# Patient Record
Sex: Male | Born: 1985 | Race: White | Hispanic: No | Marital: Single | Smoking: Current every day smoker
Health system: Southern US, Community
[De-identification: ages and names within clinical notes are randomized; demographics above are authoritative.]

---

## 2014-05-24 ENCOUNTER — Encounter (HOSPITAL_COMMUNITY): Payer: Self-pay | Admitting: Emergency Medicine

## 2014-05-24 ENCOUNTER — Emergency Department (HOSPITAL_COMMUNITY)
Admission: EM | Admit: 2014-05-24 | Discharge: 2014-05-25 | Disposition: A | Discharge status: Home / Self Care | Payer: Self-pay | Attending: Emergency Medicine | Admitting: Emergency Medicine

## 2014-05-24 ENCOUNTER — Emergency Department (HOSPITAL_COMMUNITY): Payer: Self-pay

## 2014-05-24 DIAGNOSIS — Z79899 Other long term (current) drug therapy: Secondary | ICD-10-CM | POA: Insufficient documentation

## 2014-05-24 DIAGNOSIS — S90111A Contusion of right great toe without damage to nail, initial encounter: Secondary | ICD-10-CM | POA: Insufficient documentation

## 2014-05-24 DIAGNOSIS — Y9389 Activity, other specified: Secondary | ICD-10-CM | POA: Insufficient documentation

## 2014-05-24 DIAGNOSIS — S99921A Unspecified injury of right foot, initial encounter: Secondary | ICD-10-CM | POA: Insufficient documentation

## 2014-05-24 DIAGNOSIS — Z720 Tobacco use: Secondary | ICD-10-CM | POA: Insufficient documentation

## 2014-05-24 DIAGNOSIS — W208XXA Other cause of strike by thrown, projected or falling object, initial encounter: Secondary | ICD-10-CM | POA: Insufficient documentation

## 2014-05-24 DIAGNOSIS — S92402A Displaced unspecified fracture of left great toe, initial encounter for closed fracture: Secondary | ICD-10-CM | POA: Insufficient documentation

## 2014-05-24 DIAGNOSIS — Y929 Unspecified place or not applicable: Secondary | ICD-10-CM | POA: Insufficient documentation

## 2014-05-24 MED ORDER — TRAMADOL HCL 50 MG PO TABS: 50.0000 mg | ORAL_TABLET | Freq: Four times a day (QID) | ORAL | Status: DC | PRN

## 2014-05-24 MED ORDER — NAPROXEN 500 MG PO TABS
500.0000 mg | ORAL_TABLET | Freq: Two times a day (BID) | ORAL | Status: DC
Start: 2014-05-24 — End: 2022-08-22

## 2014-05-24 NOTE — ED Provider Notes (Signed)
CSN: [id number]     Arrival date & time 05/24/14  2133 History   First MD Initiated Contact with Patient 05/24/14 2159     Chief Complaint  Patient presents with  . Toe Injury     (Consider location/radiation/quality/duration/timing/severity/associated sxs/prior Treatment) HPI Comments: Patient presenting with pain of the great toes bilaterally.  He reports that the pain has been present since a board fell on his toes two days ago.  Pain is constant and is worse with ambulation.  He has not taken anything for the pain prior to arrival.  He reports associated swelling of the left great toe.  He denies numbness or tingling.  Skin is intact.  Denies any pain of the remainder of the foot or other toes.  The history is provided by the patient.    History reviewed. No pertinent past medical history. History reviewed. No pertinent past surgical history. History reviewed. No pertinent family history. History  Substance Use Topics  . Smoking status: Current Every Day Smoker -- 0.50 packs/day    Types: Cigarettes  . Smokeless tobacco: Not on file  . Alcohol Use: No    Review of Systems  All other systems reviewed and are negative.     Allergies  Review of patient's allergies indicates no known allergies.  Home Medications   Prior to Admission medications   Medication Sig Start Date End Date Taking? Authorizing Provider  amphetamine-dextroamphetamine (ADDERALL) 30 MG tablet Take 30 mg by mouth daily.   Yes Historical Provider, MD   BP 124/72  Pulse 111  Temp(Src) 98.5 F (36.9 C) (Oral)  Resp 22  SpO2 96% Physical Exam  Nursing note and vitals reviewed. Constitutional: He appears well-developed and well-nourished.  HENT:  Head: Normocephalic and atraumatic.  Cardiovascular: Normal rate, regular rhythm and normal heart sounds.   Pulses:      Dorsalis pedis pulses are 2+ on the right side, and 2+ on the left side.  Pulmonary/Chest: Effort normal and breath sounds normal.   Musculoskeletal:  Pain with ROM of both great toes Mild swelling of the left great toe No swelling of the right great toe Small amount of mild bruising at the base of the right great toe nail No tenderness to palpation of the foot aside from the great toes.   Patient able to wiggle all toes.    Neurological: He is alert.  Distal sensation of all toes intact  Skin: Skin is warm and dry.  Psychiatric: He has a normal mood and affect.    ED Course  Procedures (including critical care time) Labs Review Labs Reviewed - No data to display  Imaging Review Dg Toe Great Left  05/24/2014   CLINICAL DATA:  Metal bar hit left great toe, pain  EXAM: LEFT GREAT TOE  COMPARISON:  None.  FINDINGS: Nondisplaced fracture involving the lateral base of the 1st distal phalanx.  The joint spaces are preserved.  The visualized soft tissues are unremarkable.  IMPRESSION: Nondisplaced fracture involving the lateral base of the 1st distal phalanx.   Electronically Signed   By: Charline BillsSriyesh  Krishnan M.D.   On: 05/24/2014 23:14   Dg Toe Great Right  05/24/2014   CLINICAL DATA:  Metal bar fell on right great toe, pain  EXAM: RIGHT GREAT TOE  COMPARISON:  None.  FINDINGS: No fracture or dislocation is seen.  The joint spaces are preserved.  The visualized soft tissues are unremarkable.  IMPRESSION: No fracture or dislocation is seen.   Electronically Signed  By: Charline Bills M.D.   On: 05/24/2014 23:14     EKG Interpretation None      MDM   Final diagnoses:  None   Patient presenting with bilateral great toe pain that has been present since injuring it two days ago.  Xray showing nondisplaced fracture of the 1st distal phalanx.  Right great toe xray negative.  Patient neurovascularly intact.  Skin intact.  Patient given post op shoe and crutches.  Stable for discharge.    Santiago Glad, PA-C 05/25/14 0006

## 2014-05-24 NOTE — Progress Notes (Signed)
Orthopedic Tech Progress Note Patient Details:  Todd GravesBrandon Thompson [birth year] [id number]  Ortho Devices Type of Ortho Device: Postop shoe/boot;Crutches Ortho Device/Splint Interventions: Application   [name], Goble Fudala M 05/24/2014, 11:43 PM

## 2014-05-24 NOTE — ED Notes (Signed)
Patient arrives with complaint of bilateral great toe injuries. 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 This happened on Tuesday. Patient states that the pain has been consistent and unrelieved at home. Right toe appears bruised, left toe appears normal but is tender to palpation. Patient ambulatory. Denies other injury.

## 2014-05-24 NOTE — ED Notes (Signed)
Ortho at [facility]

## 2014-05-28 NOTE — ED Provider Notes (Signed)
Medical screening examination/treatment/procedure(s) were performed by non-physician practitioner and as supervising physician I was immediately available for consultation/collaboration.   EKG Interpretation None        Kaulin Chaves J. Marijke Guadiana, MD 05/28/14 0700 

## 2015-03-22 ENCOUNTER — Emergency Department (HOSPITAL_COMMUNITY)
Admission: EM | Admit: 2015-03-22 | Discharge: 2015-03-22 | Disposition: A | Discharge status: Home / Self Care | Payer: Self-pay | Attending: Emergency Medicine | Admitting: Emergency Medicine

## 2015-03-22 ENCOUNTER — Encounter (HOSPITAL_COMMUNITY): Payer: Self-pay

## 2015-03-22 DIAGNOSIS — Z720 Tobacco use: Secondary | ICD-10-CM | POA: Insufficient documentation

## 2015-03-22 DIAGNOSIS — L259 Unspecified contact dermatitis, unspecified cause: Secondary | ICD-10-CM | POA: Insufficient documentation

## 2015-03-22 DIAGNOSIS — Z791 Long term (current) use of non-steroidal anti-inflammatories (NSAID): Secondary | ICD-10-CM | POA: Insufficient documentation

## 2015-03-22 DIAGNOSIS — Z79899 Other long term (current) drug therapy: Secondary | ICD-10-CM | POA: Insufficient documentation

## 2015-03-22 MED ORDER — CEPHALEXIN 500 MG PO CAPS: 500.0000 mg | ORAL_CAPSULE | Freq: Four times a day (QID) | ORAL | Status: DC

## 2015-03-22 MED ORDER — CEPHALEXIN 250 MG PO CAPS
500.0000 mg | ORAL_CAPSULE | Freq: Once | ORAL | Status: AC
  Administered 2015-03-22: 500 mg via ORAL
  Filled 2015-03-22: qty 2

## 2015-03-22 MED ORDER — PREDNISONE 20 MG PO TABS
60.0000 mg | ORAL_TABLET | Freq: Once | ORAL | Status: AC
  Administered 2015-03-22: 60 mg via ORAL
  Filled 2015-03-22: qty 3

## 2015-03-22 MED ORDER — HYDROXYZINE HCL 25 MG PO TABS
25.0000 mg | ORAL_TABLET | Freq: Once | ORAL | Status: AC
  Administered 2015-03-22: 25 mg via ORAL
  Filled 2015-03-22: qty 1

## 2015-03-22 MED ORDER — PREDNISONE 20 MG PO TABS: 60.0000 mg | ORAL_TABLET | Freq: Every day | ORAL | Status: DC

## 2015-03-22 MED ORDER — HYDROXYZINE HCL 25 MG PO TABS: 25.0000 mg | ORAL_TABLET | Freq: Four times a day (QID) | ORAL | Status: DC

## 2015-03-22 NOTE — ED Notes (Signed)
Patient is alert and orientedx4.  Patient was explained discharge instructions and they understood them with no questions.   

## 2015-03-22 NOTE — Discharge Instructions (Signed)

## 2015-03-22 NOTE — ED Provider Notes (Signed)
CSN: [id number]     Arrival date & time 03/22/15  4098 History   First MD Initiated Contact with Patient 03/22/15 [phone]     Chief Complaint  Patient presents with  . Rash  . Poison Ivy     (Consider location/radiation/quality/duration/timing/severity/associated sxs/prior Treatment) Patient is a 29 y.o. male presenting with rash and poison ivy. The history is provided by the patient. No language interpreter was used.  Rash Location:  Full body Quality: blistering, itchiness, painful and redness   Associated symptoms: no fever   Associated symptoms comment:  Patient with a history recent exposure to poison oak presents with widespread rash for the past week. It started on UE's bilaterally and continued to spread to face, lower extremities and torso. No fever. He states some of the lesions are now extremely red and he was concerned about infection.  Poison Ivy Associated symptoms include a rash. Pertinent negatives include no fever.    History reviewed. No pertinent past medical history. History reviewed. No pertinent past surgical history. History reviewed. No pertinent family history. History  Substance Use Topics  . Smoking status: Current Every Day Smoker -- 0.50 packs/day    Types: Cigarettes  . Smokeless tobacco: Not on file  . Alcohol Use: No    Review of Systems  Constitutional: Negative for fever.  Skin: Positive for rash.      Allergies  Review of patient's allergies indicates no known allergies.  Home Medications   Prior to Admission medications   Medication Sig Start Date End Date Taking? Authorizing Provider  amphetamine-dextroamphetamine (ADDERALL) 30 MG tablet Take 30 mg by mouth daily.    Historical Provider, MD  naproxen (NAPROSYN) 500 MG tablet Take 1 tablet (500 mg total) by mouth 2 (two) times daily. 05/24/14   Heather Laisure, PA-C  traMADol (ULTRAM) 50 MG tablet Take 1 tablet (50 mg total) by mouth every 6 (six) hours as needed. 05/24/14   Heather  Laisure, PA-C   BP 109/57 mmHg  Pulse 88  Temp(Src) 97.7 F (36.5 C) (Oral)  Resp 20  Ht  (1.702 m)  Wt 175 lb (79.379 kg)  BMI 27.40 kg/m2  SpO2 100% Physical Exam  Constitutional: He is oriented to person, place, and time. He appears well-developed and well-nourished.  Neck: Normal range of motion.  Pulmonary/Chest: Effort normal.  Musculoskeletal: Normal range of motion.  Neurological: He is alert and oriented to person, place, and time.  Skin: Skin is warm and dry.  Rash consisting of round, erythematous ulcerations in various stages of healing in a generalized distribution.   Psychiatric: He has a normal mood and affect.    ED Course  Procedures (including critical care time) Labs Review Labs Reviewed - No data to display  Imaging Review No results found.   EKG Interpretation None      MDM   Final diagnoses:  None    1. Contact dermatitis  Someo f the scabbed lesions appear significantly erythematous and tender. Will cover with Keflex, start prednisone and provide Atarax as benadryl has not provided itch relief.     Elpidio Anis, PA-C 03/22/15 4782  Dione Booze, MD 03/22/15 [phone]

## 2015-03-22 NOTE — ED Notes (Signed)
Here for rash to arms and face after being oustide Friday and sweating and sts it has spread to more of body and possibly infected.

## 2015-11-23 IMAGING — CR DG TOE GREAT 2+V*R*
3 series · 3 of 3 positions shown · non-contrast
Comparison: None.

CLINICAL DATA: Metal bar fell on right great toe, pain

EXAM:
RIGHT GREAT TOE

[t toes ap right]
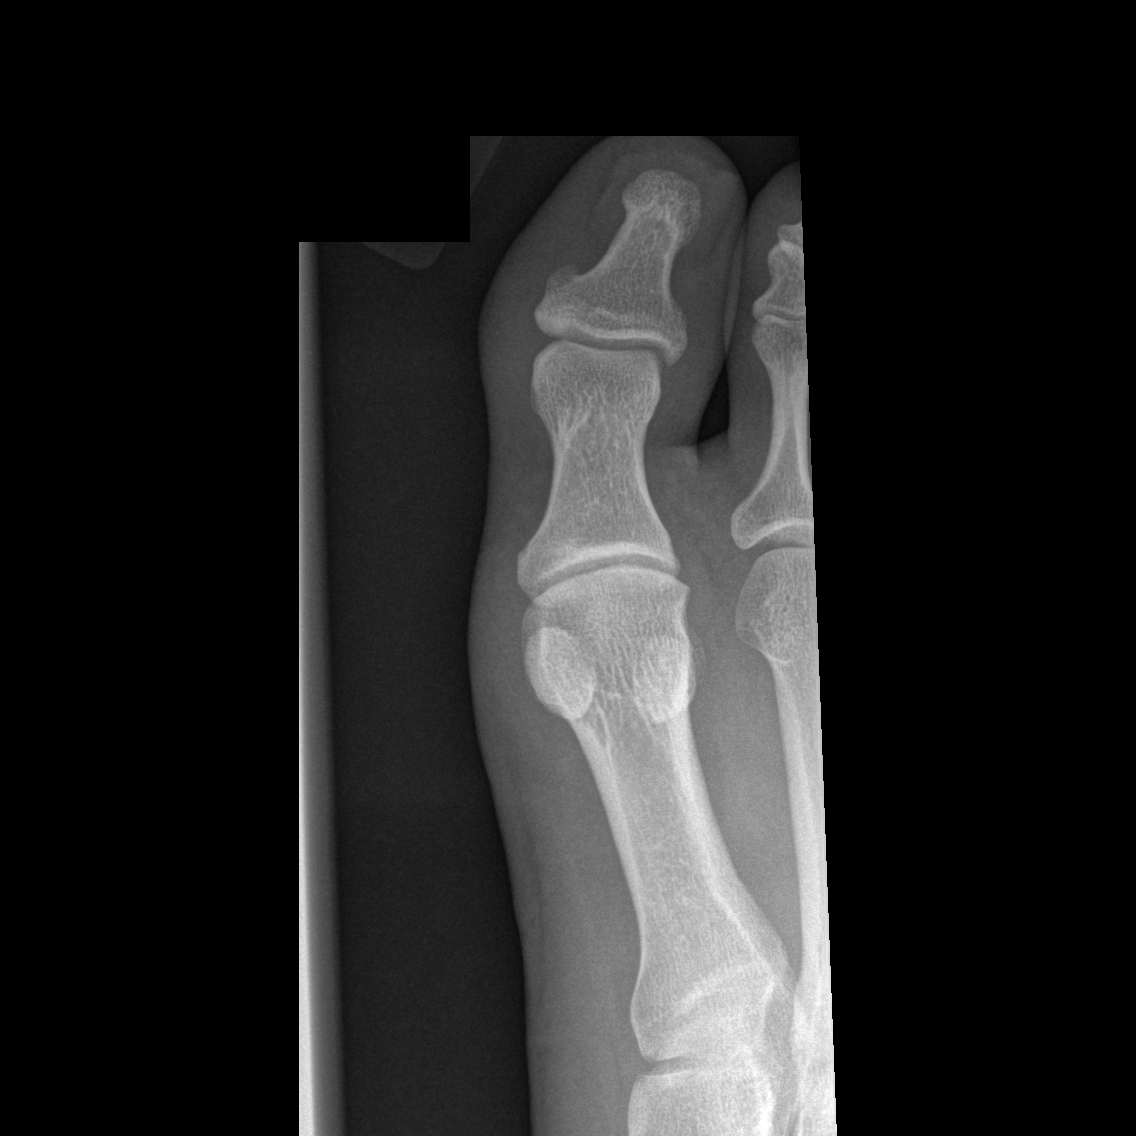

[t toes oblique right]
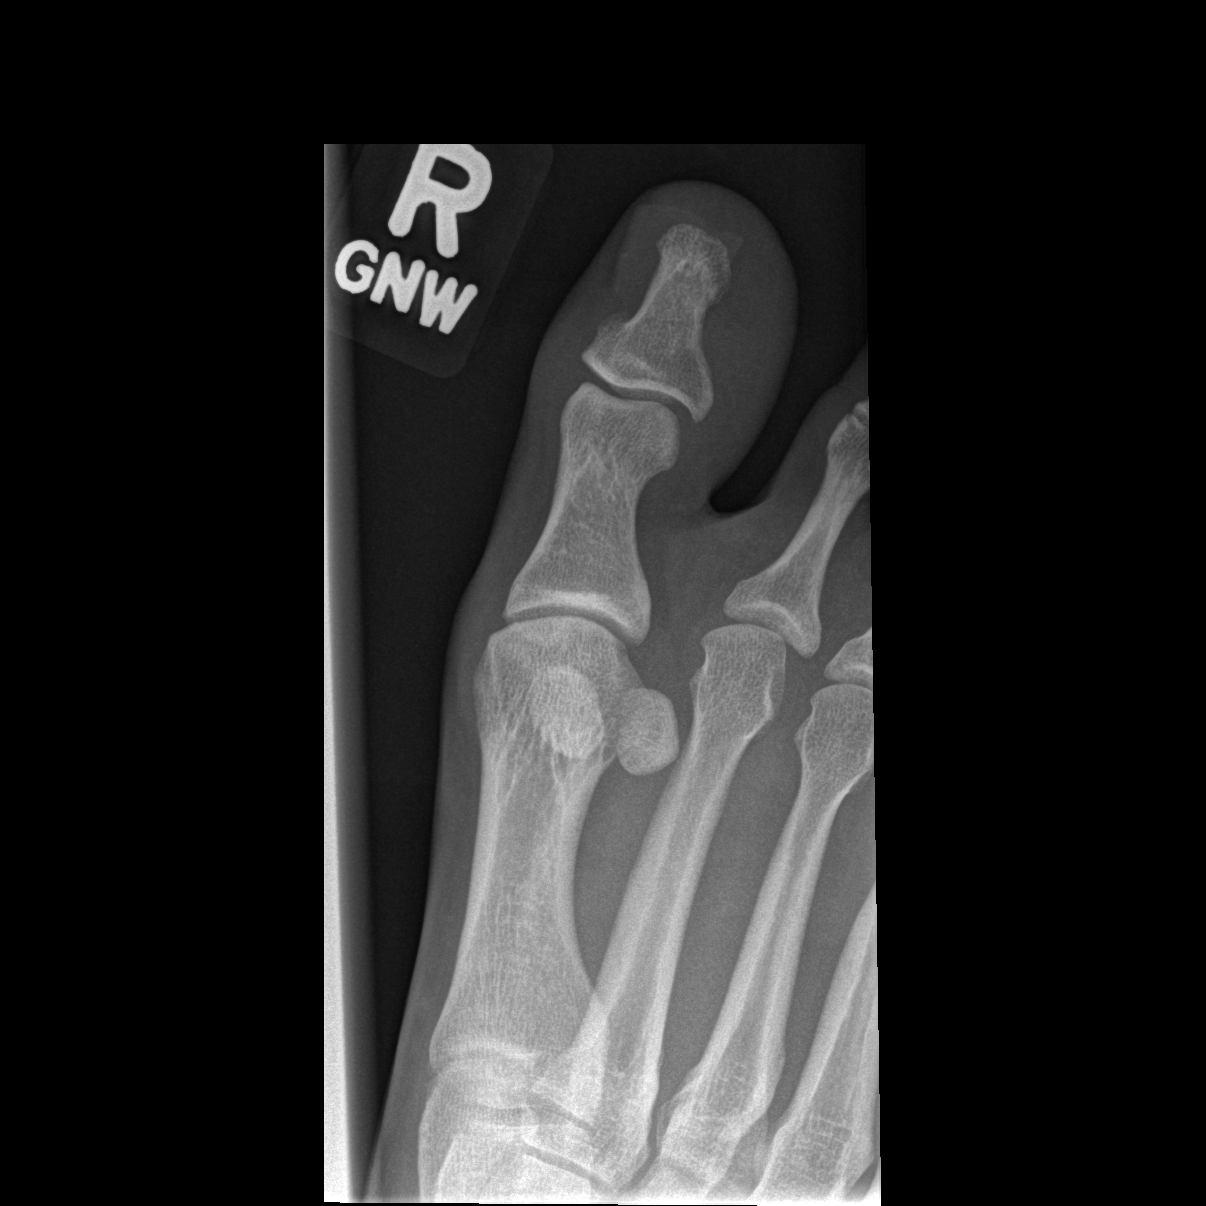

[t toes lateral right]
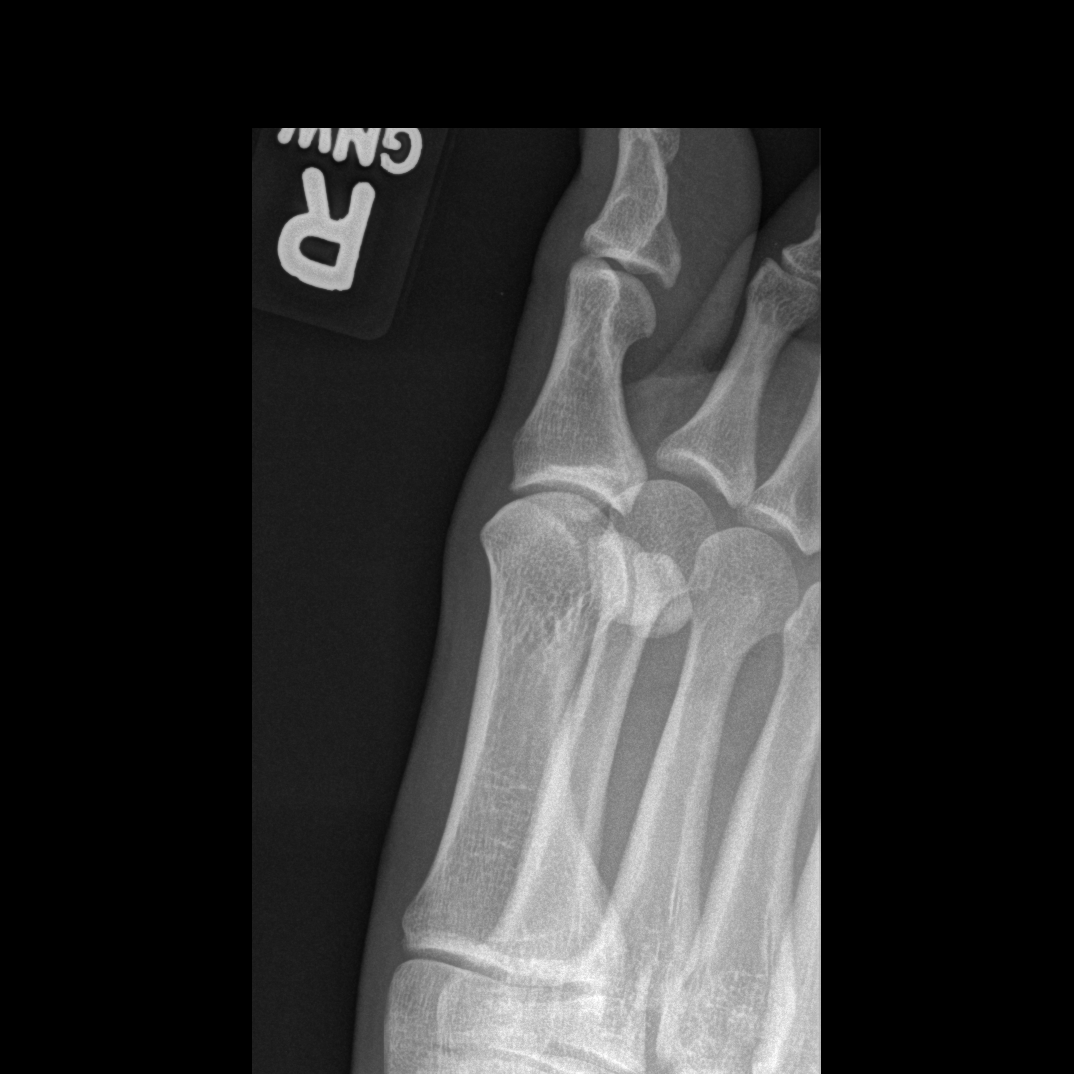

[3 of 3 positions shown; findings below may reference images not displayed]

FINDINGS: No fracture or dislocation is seen.

The joint spaces are preserved.

The visualized soft tissues are unremarkable.
IMPRESSION: No fracture or dislocation is seen.

## 2016-12-12 ENCOUNTER — Encounter (HOSPITAL_COMMUNITY): Payer: Self-pay | Admitting: Nurse Practitioner

## 2016-12-12 ENCOUNTER — Emergency Department (HOSPITAL_COMMUNITY)
Admission: EM | Admit: 2016-12-12 | Discharge: 2016-12-12 | Disposition: A | Discharge status: Home / Self Care | Payer: Self-pay | Attending: Emergency Medicine | Admitting: Emergency Medicine

## 2016-12-12 DIAGNOSIS — F17210 Nicotine dependence, cigarettes, uncomplicated: Secondary | ICD-10-CM | POA: Insufficient documentation

## 2016-12-12 DIAGNOSIS — K029 Dental caries, unspecified: Secondary | ICD-10-CM | POA: Insufficient documentation

## 2016-12-12 DIAGNOSIS — Z79899 Other long term (current) drug therapy: Secondary | ICD-10-CM | POA: Insufficient documentation

## 2016-12-12 DIAGNOSIS — K0889 Other specified disorders of teeth and supporting structures: Secondary | ICD-10-CM

## 2016-12-12 MED ORDER — CLINDAMYCIN HCL 300 MG PO CAPS: 300.0000 mg | ORAL_CAPSULE | Freq: Three times a day (TID) | ORAL | 0 refills | Status: AC

## 2016-12-12 MED ORDER — OXYCODONE-ACETAMINOPHEN 5-325 MG PO TABS
1.0000 | ORAL_TABLET | Freq: Once | ORAL | Status: AC
  Administered 2016-12-12: 1 via ORAL
  Filled 2016-12-12: qty 1

## 2016-12-12 MED ORDER — TRAMADOL HCL 50 MG PO TABS: 50.0000 mg | ORAL_TABLET | Freq: Four times a day (QID) | ORAL | 0 refills | Status: AC | PRN

## 2016-12-12 MED ORDER — IBUPROFEN 600 MG PO TABS: 600.0000 mg | ORAL_TABLET | Freq: Four times a day (QID) | ORAL | 0 refills | Status: DC | PRN

## 2016-12-12 NOTE — ED Provider Notes (Signed)
[facility]-EMERGENCY DEPT Provider Note   CSN: [id number] Arrival date & time: 12/12/16  1402   By signing my name below, I, Teofilo Pod, attest that this documentation has been prepared under the direction and in the presence of Audry Pili, PA-C. Electronically Signed: Teofilo Pod, ED Scribe. 12/12/2016. 4:13 PM.   History   Chief Complaint Chief Complaint  Patient presents with  . Dental Pain    The history is provided by the patient. No language interpreter was used.  HPI Comments:  Todd Thompson is a 31 y.o. male who presents to the Emergency Department complaining of constant left sided upper dental pain x 2 days. Pt complains of associated gum swelling. Pt does not have a dentist at this time. Denies any known allergies to medications. He has taken tylenol with no relief. Denies any trouble swallowing.   History reviewed. No pertinent past medical history.  There are no active problems to display for this patient.   History reviewed. No pertinent surgical history.     Home Medications    Prior to Admission medications   Medication Sig Start Date End Date Taking? Authorizing Provider  amphetamine-dextroamphetamine (ADDERALL) 30 MG tablet Take 30 mg by mouth daily.    Historical Provider, MD  cephALEXin (KEFLEX) 500 MG capsule Take 1 capsule (500 mg total) by mouth 4 (four) times daily. 03/22/15   Elpidio Anis, PA-C  hydrOXYzine (ATARAX/VISTARIL) 25 MG tablet Take 1 tablet (25 mg total) by mouth every 6 (six) hours. 03/22/15   Elpidio Anis, PA-C  naproxen (NAPROSYN) 500 MG tablet Take 1 tablet (500 mg total) by mouth 2 (two) times daily. 05/24/14   Heather Laisure, PA-C  predniSONE (DELTASONE) 20 MG tablet Take 3 tablets (60 mg total) by mouth daily. 03/22/15   Elpidio Anis, PA-C  traMADol (ULTRAM) 50 MG tablet Take 1 tablet (50 mg total) by mouth every 6 (six) hours as needed. 05/24/14   Santiago Glad, PA-C    Family History History reviewed. No pertinent  family history.  Social History Social History  Substance Use Topics  . Smoking status: Current Every Day Smoker    Packs/day: 0.50    Types: Cigarettes  . Smokeless tobacco: Never Used  . Alcohol use No     Allergies   Patient has no known allergies.   Review of Systems Review of Systems  Constitutional: Negative for fever.  HENT: Positive for dental problem and facial swelling. Negative for trouble swallowing.    Physical Exam Updated Vital Signs BP 134/82   Pulse (!) 113   Temp 98.4 F (36.9 C) (Oral)   Resp 16   SpO2 97%   Physical Exam  Constitutional: He is oriented to person, place, and time. Vital signs are normal. He appears well-developed and well-nourished.  HENT:  Head: Normocephalic and atraumatic.  Right Ear: Hearing normal.  Left Ear: Hearing normal.  Poor dentition noted on frontal incisors. No obvious swelling or purulence. No trismus, no uvula deviation, phonating well.   Eyes: Conjunctivae and EOM are normal. Pupils are equal, round, and reactive to light.  Cardiovascular: Normal rate and regular rhythm.   Pulmonary/Chest: Effort normal.  Neurological: He is alert and oriented to person, place, and time.  Skin: Skin is warm and dry.  Psychiatric: He has a normal mood and affect. His speech is normal and behavior is normal. Thought content normal.  Nursing note and vitals reviewed.  ED Treatments / Results  DIAGNOSTIC STUDIES:  Oxygen Saturation is 97% on  RA, normal by my interpretation.    COORDINATION OF CARE:  4:10 PM Will refer to a dentist. Discussed treatment plan with pt at bedside and pt agreed to plan.   Labs (all labs ordered are listed, but only abnormal results are displayed) Labs Reviewed - No data to display  EKG  EKG Interpretation None       Radiology No results found.  Procedures Procedures (including critical care time)  Medications Ordered in ED Medications - No data to display   Initial Impression /  Assessment and Plan / ED Course  I have reviewed the triage vital signs and the nursing notes.  Pertinent labs & imaging results that were available during my care of the patient were reviewed by me and considered in my medical decision making (see chart for details).  Final Clinical Impressions(s) / ED Diagnoses     {I have reviewed the relevant previous healthcare records.  {I obtained HPI from historian.   ED Course:  Assessment: Dental pain associated with dental cary but no signs or symptoms of dental abscess with patient afebrile, non toxic appearing and swallowing secretions well. Exam unconcerning for Ludwig's angina or other deep tissue infection in neck. No facial swelling. Noted gum findings. Will Rx ABX. Will treat with pain medication.  I gave patient referral to dentist and stressed the importance of dental follow up for ultimate management of dental pain. Patient voices understanding and is agreeable to plan.  Disposition/Plan:  DC Home Additional Verbal discharge instructions given and discussed with patient.  Pt Instructed to f/u with Dentist in the next week for evaluation and treatment of symptoms. Return precautions given Pt acknowledges and agrees with plan  Supervising Physician Melene Plan, DO  Final diagnoses:  Pain, dental  Dental caries    New Prescriptions New Prescriptions   No medications on file   I personally performed the services described in this documentation, which was scribed in my presence. The recorded information has been reviewed and is accurate.    Audry Pili, PA-C 12/12/16 1628    Melene Plan, DO 12/13/16 [phone]

## 2016-12-12 NOTE — ED Triage Notes (Signed)
Pt presents with c/o dental pain. The pain began 2 days go. He c/o gum swelling. He has tried tylenol with no relief

## 2016-12-12 NOTE — Discharge Instructions (Signed)
Please read and follow all provided instructions.  Your diagnoses today include:  1. Pain, dental   2. Dental caries     Tests performed today include: Vital signs. See below for your results today.   Medications prescribed:  Take as prescribed   Home care instructions:  Follow any educational materials contained in this packet.  Follow-up instructions: Please follow-up with a Dentist for further evaluation of symptoms and treatment   Return instructions:  Please return to the Emergency Department if you do not get better, if you get worse, or new symptoms OR  - Fever (temperature greater than 101.92F)  - Bleeding that does not stop with holding pressure to the area    -Severe pain (please note that you may be more sore the day after your accident)  - Chest Pain  - Difficulty breathing  - Severe nausea or vomiting  - Inability to tolerate food and liquids  - Passing out  - Skin becoming red around your wounds  - Change in mental status (confusion or lethargy)  - New numbness or weakness    Please return if you have any other emergent concerns.  Additional Information:  Your vital signs today were: BP 134/82    Pulse (!) 113    Temp 98.4 F (36.9 C) (Oral)    Resp 16    SpO2 97%  If your blood pressure (BP) was elevated above 135/85 this visit, please have this repeated by your doctor within one month. ---------------

## 2017-05-16 DIAGNOSIS — F419 Anxiety disorder, unspecified: Secondary | ICD-10-CM

## 2017-05-16 DIAGNOSIS — E669 Obesity, unspecified: Secondary | ICD-10-CM

## 2017-05-16 DIAGNOSIS — Z789 Other specified health status: Secondary | ICD-10-CM

## 2017-05-16 DIAGNOSIS — R480 Dyslexia and alexia: Secondary | ICD-10-CM

## 2017-05-16 DIAGNOSIS — F909 Attention-deficit hyperactivity disorder, unspecified type: Secondary | ICD-10-CM

## 2017-05-16 DIAGNOSIS — F319 Bipolar disorder, unspecified: Secondary | ICD-10-CM

## 2017-05-16 DIAGNOSIS — L03113 Cellulitis of right upper limb: Secondary | ICD-10-CM

## 2017-05-16 DIAGNOSIS — Z720 Tobacco use: Secondary | ICD-10-CM

## 2018-02-23 DIAGNOSIS — N179 Acute kidney failure, unspecified: Secondary | ICD-10-CM

## 2018-02-23 DIAGNOSIS — F320 Major depressive disorder, single episode, mild: Secondary | ICD-10-CM

## 2018-02-23 DIAGNOSIS — E860 Dehydration: Secondary | ICD-10-CM

## 2018-02-23 DIAGNOSIS — F1910 Other psychoactive substance abuse, uncomplicated: Secondary | ICD-10-CM

## 2018-02-25 DIAGNOSIS — I517 Cardiomegaly: Secondary | ICD-10-CM

## 2018-02-25 DIAGNOSIS — N189 Chronic kidney disease, unspecified: Secondary | ICD-10-CM

## 2019-03-15 ENCOUNTER — Emergency Department (HOSPITAL_COMMUNITY)
Admission: EM | Admit: 2019-03-15 | Discharge: 2019-03-15 | Disposition: A | Discharge status: Home / Self Care | Payer: Self-pay | Attending: Emergency Medicine | Admitting: Emergency Medicine

## 2019-03-15 ENCOUNTER — Other Ambulatory Visit: Payer: Self-pay

## 2019-03-15 DIAGNOSIS — F17210 Nicotine dependence, cigarettes, uncomplicated: Secondary | ICD-10-CM | POA: Insufficient documentation

## 2019-03-15 DIAGNOSIS — B353 Tinea pedis: Secondary | ICD-10-CM | POA: Insufficient documentation

## 2019-03-15 DIAGNOSIS — S76011A Strain of muscle, fascia and tendon of right hip, initial encounter: Secondary | ICD-10-CM | POA: Insufficient documentation

## 2019-03-15 DIAGNOSIS — Y939 Activity, unspecified: Secondary | ICD-10-CM | POA: Insufficient documentation

## 2019-03-15 DIAGNOSIS — Y929 Unspecified place or not applicable: Secondary | ICD-10-CM | POA: Insufficient documentation

## 2019-03-15 DIAGNOSIS — Y999 Unspecified external cause status: Secondary | ICD-10-CM | POA: Insufficient documentation

## 2019-03-15 DIAGNOSIS — Z79899 Other long term (current) drug therapy: Secondary | ICD-10-CM | POA: Insufficient documentation

## 2019-03-15 DIAGNOSIS — X500XXA Overexertion from strenuous movement or load, initial encounter: Secondary | ICD-10-CM | POA: Insufficient documentation

## 2019-03-15 MED ORDER — CLOTRIMAZOLE 1 % EX CREA: TOPICAL_CREAM | CUTANEOUS | 0 refills | Status: AC

## 2019-03-15 NOTE — Discharge Instructions (Signed)
Please read attached information. If you experience any new or worsening signs or symptoms please return to the emergency room for evaluation. Please follow-up with your primary care provider or specialist as discussed.  °

## 2019-03-15 NOTE — ED Provider Notes (Signed)
[facility] Provider Note   CSN: [id number] Arrival date & time: 03/15/19  1554    History   Chief Complaint Chief Complaint  Patient presents with  . Groin Pain  . Tinea Pedis    HPI Todd Thompson is a 33 y.o. male.     HPI   33 year old male presents today with several complaints.  Patient notes that he jumped into his mother's pickup truck and felt a pulling sensation in his right groin.  He notes the pain has persisted he.  He denies any swelling or edema, denies any testicular pain or masses, no urinary symptoms or abdominal pain.  Patient also notes a rash to his bilateral feet that is itchy.   No past medical history on file.  There are no active problems to display for this patient.   No past surgical history on file.      Home Medications    Prior to Admission medications   Medication Sig Start Date End Date Taking? Authorizing Provider  amphetamine-dextroamphetamine (ADDERALL) 30 MG tablet Take 30 mg by mouth daily.    [provider]  cephALEXin (KEFLEX) 500 MG capsule Take 1 capsule (500 mg total) by mouth 4 (four) times daily. 03/22/15   Elpidio AnisUpstill, Shari, PA-C  clotrimazole (LOTRIMIN) 1 % cream Apply to affected area 2 times daily 03/15/19   Ritchard Paragas, Tinnie GensJeffrey, PA-C  hydrOXYzine (ATARAX/VISTARIL) 25 MG tablet Take 1 tablet (25 mg total) by mouth every 6 (six) hours. 03/22/15   Elpidio AnisUpstill, Shari, PA-C  ibuprofen (ADVIL,MOTRIN) 600 MG tablet Take 1 tablet (600 mg total) by mouth every 6 (six) hours as needed. 12/12/16   Audry PiliMohr, Tyler, PA-C  naproxen (NAPROSYN) 500 MG tablet Take 1 tablet (500 mg total) by mouth 2 (two) times daily. 05/24/14   Santiago GladLaisure, Heather, PA-C  predniSONE (DELTASONE) 20 MG tablet Take 3 tablets (60 mg total) by mouth daily. 03/22/15   Elpidio AnisUpstill, Shari, PA-C  traMADol (ULTRAM) 50 MG tablet Take 1 tablet (50 mg total) by mouth every 6 (six) hours as needed. 12/12/16   Audry PiliMohr, Tyler, PA-C    Family History  No family history on file.  Social History Social History   Tobacco Use  . Smoking status: Current Every Day Smoker    Packs/day: 0.50    Types: Cigarettes  . Smokeless tobacco: Never Used  Substance Use Topics  . Alcohol use: No  . Drug use: No     Allergies   Patient has no known allergies.   Review of Systems Review of Systems  All other systems reviewed and are negative.    Physical Exam Updated Vital Signs BP 123/83 (BP Location: Right Arm)   Pulse 100   Temp 99.3 F (37.4 C) (Oral)   Resp 16   SpO2 100%   Physical Exam Vitals signs and nursing note reviewed.  Constitutional:      Appearance: He is well-developed.  HENT:     Head: Normocephalic and atraumatic.  Eyes:     General: No scleral icterus.       Right eye: No discharge.        Left eye: No discharge.     Conjunctiva/sclera: Conjunctivae normal.     Pupils: Pupils are equal, round, and reactive to light.  Neck:     Musculoskeletal: Normal range of motion.     Vascular: No JVD.     Trachea: No tracheal deviation.  Pulmonary:     Effort: Pulmonary effort is normal.  Breath sounds: No stridor.  Abdominal:     Comments: Abdomen soft nontender no masses rebound or guarding no hernias  Genitourinary:    Comments: Bilateral descended testicles, no hernias noted Musculoskeletal:     Comments: Tenderness palpation the right ASIS in the anterior groin, pain worse with active flexion at the right hip minimal pain with passive flexion no swelling or redness  Neurological:     Mental Status: He is alert and oriented to person, place, and time.     Coordination: Coordination normal.  Psychiatric:        Behavior: Behavior normal.        Thought Content: Thought content normal.        Judgment: Judgment normal.      ED Treatments / Results  Labs (all labs ordered are listed, but only abnormal results are displayed) Labs Reviewed - No data to display  EKG None  Radiology No results  found.  Procedures Procedures (including critical care time)  Medications Ordered in ED Medications - No data to display   Initial Impression / Assessment and Plan / ED Course  I have reviewed the triage vital signs and the nursing notes.  Pertinent labs & imaging results that were available during my care of the patient were reviewed by me and considered in my medical decision making (see chart for details).        Patient here with likely hip flexor strain.  No signs of hernia.  Patient also has tinea pedis and will be treated with antifungal.  Strict return precautions given, symptomatic care instructions given.  He verbalized understanding and agreement to today's plan had no further questions or concerns.  Final Clinical Impressions(s) / ED Diagnoses   Final diagnoses:  Strain of flexor muscle of right hip, initial encounter  Tinea pedis of both feet    ED Discharge Orders         Ordered    clotrimazole (LOTRIMIN) 1 % cream     03/15/19 1751           Okey Regal, PA-C 03/16/19 1242    Blanchie Dessert, MD 03/18/19 [phone]

## 2019-03-15 NOTE — ED Triage Notes (Signed)
Onset in the past 2 days pt was getting in moms truck and jumped up, grabbed hold of bar and felt something pull.  Also has athletes foot in right foot, skin is cracked in between toes and hurts to put pressure on right foot.  Also, c/o back pain.

## 2020-09-05 ENCOUNTER — Ambulatory Visit (HOSPITAL_COMMUNITY)
Admission: EM | Admit: 2020-09-05 | Discharge: 2020-09-05 | Discharge status: Against Medical Advice | Payer: No Payment, Other

## 2020-09-05 ENCOUNTER — Other Ambulatory Visit: Payer: Self-pay

## 2020-09-05 NOTE — ED Notes (Signed)
Left after triage without being seen by provider. Stated he had somewhere he had to be

## 2020-09-05 NOTE — ED Triage Notes (Signed)
Patient presents to [facility] as a walk-in, denies , SI, HI and AVH. Patient states "[facility] sent me here to get my medications, they said you have taken over what they were doings. [facility] does not take my insurance anymore."

## 2020-11-11 ENCOUNTER — Ambulatory Visit (HOSPITAL_COMMUNITY)
Admission: EM | Admit: 2020-11-11 | Discharge: 2020-11-11 | Disposition: A | Discharge status: Home / Self Care | Payer: No Payment, Other | Attending: Licensed Clinical Social Worker | Admitting: Licensed Clinical Social Worker

## 2020-11-11 ENCOUNTER — Other Ambulatory Visit: Payer: Self-pay

## 2020-11-11 DIAGNOSIS — Z1330 Encounter for screening examination for mental health and behavioral disorders, unspecified: Secondary | ICD-10-CM | POA: Insufficient documentation

## 2020-11-11 DIAGNOSIS — Z5321 Procedure and treatment not carried out due to patient leaving prior to being seen by health care provider: Secondary | ICD-10-CM | POA: Diagnosis not present

## 2020-11-11 DIAGNOSIS — F209 Schizophrenia, unspecified: Secondary | ICD-10-CM | POA: Insufficient documentation

## 2020-11-11 DIAGNOSIS — F1990 Other psychoactive substance use, unspecified, uncomplicated: Secondary | ICD-10-CM | POA: Insufficient documentation

## 2020-11-11 NOTE — BH Assessment (Signed)
Disposition: Pt did not have opportunity to see provider for appropriate disposition--pt elected to leave without being seen by provider. Pts mother stated that she had urgent business to attend to in [place].    Comprehensive Clinical Assessment (CCA) Note  11/11/2020 Todd Thompson [id number]  Chief Complaint: No chief complaint on file.  Visit Diagnosis:  Schizophrenia Substance use  Flowsheet Row ED from 11/11/2020 in [facility]  C-SSRS RISK CATEGORY No Risk     The patient demonstrates the following risk factors for suicide: Chronic risk factors for suicide include: psychiatric disorder of schizophrenia and substance use disorder. Acute risk factors for suicide include: unemployment and social withdrawal/isolation. Protective factors for this patient include: positive social support. Considering these factors, the overall suicide risk at this point appears to be low. Patient is appropriate for outpatient follow up.     Todd Thompson is a 35yo male reporting to [facility] as a walk-in accompanied by his mother for evaluation of concerning behaviors that his mother is seeing. Pt and mother both report that pt has been off medication for a while. Pts mother reports that pt has a previous diagnosis of schizophrenia and "used to get those shots every other month". Pt and mother both state that they don't feel the shots were managing symptoms. Pts mother reports the pt has not been himself lately. Pts mother reports to triage that pt has been smoking methamphetamine, but pt denies during assessment. Pt did report that he drinks alcohol daily and that "drinking is really good for me--it unlocks all that happiness inside of me". Pt reports that occasionally smokes THC. Pt denies any HI or AVH--pts mother states that "he's talking about all kinds of stuff" and "he's yelling at nobody". Pt reports that he wants someone to prescribe Ritalin and Valium for him--"they are the only  medications that have ever worked for me". Pt reports that he was prescribed Ritalin and Valium 25 years ago and that he is having a hard time finding a provider that will write Rx for them. Pt denies that he is a danger to himself tonight--pts mother feels that he is a danger to himself tonight.  CCA Screening, Triage and Referral (STR)  Patient Reported Information How did you hear about [place]? Other (Comment) (Phreesia 09/05/2020)  Referral name: Self  Referral phone number: No data recorded  Whom do you see for routine medical problems? I don't have a doctor (Phreesia 09/05/2020)  Practice/Facility Name: No data recorded Practice/Facility Phone Number: No data recorded Name of Contact: No data recorded Contact Number: No data recorded Contact Fax Number: No data recorded Prescriber Name: No data recorded Prescriber Address (if known): No data recorded  What Is the Reason for Your Visit/Call Today? Patient presents reporting he has been off of meds for a while.  Patient is restless, pacing in the lobby, possibly impaired.  Patient's mother states he needs to be seen urgently, as he "is not acting like himself." Patient has been off of medications for over 8 months.  He ha recently used meth, 5 days ago.  He states he has multiple psych diagnoses,however unable to give diagnoses.  How Long Has This Been Causing You Problems? 1-6 months  What Do You Feel Would Help You the Most Today? Medication(s)   Have You Recently Been in Any Inpatient Treatment (Hospital/Detox/Crisis Center/28-Day Program)? No (Phreesia 09/05/2020)  Name/Location of Program/Hospital:No data recorded How Long Were You There? No data recorded When Were You Discharged? No data recorded  Have You Ever Received Services From [facility] Before? Yes (Phreesia 09/05/2020)  Who Do You See at [facility]? ER (Phreesia 09/05/2020)   Have You Recently Had Any Thoughts About Hurting Yourself? No  Are You Planning to  Commit Suicide/Harm Yourself At This time? No   Have you Recently Had Thoughts About Hurting Someone Karolee Ohs? No (Phreesia 09/05/2020)  Explanation: No data recorded  Have You Used Any Alcohol or Drugs in the Past 24 Hours? No (recent meth use, states last use was 5 days ago)  How Long Ago Did You Use Drugs or Alcohol? No data recorded What Did You Use and How Much? Beer Two (Phreesia 09/05/2020)   Do You Currently Have a Therapist/Psychiatrist? No  Name of Therapist/Psychiatrist: No data recorded  Have You Been Recently Discharged From Any Office Practice or Programs? No  Explanation of Discharge From Practice/Program: No data recorded    CCA Screening Triage Referral Assessment Type of Contact: Face-to-Face  Is this Initial or Reassessment? No data recorded Date Telepsych consult ordered in [facility]:  No data recorded Time Telepsych consult ordered in [facility]:  No data recorded  Patient Reported Information Reviewed? Yes  Patient Left Without Being Seen? No data recorded Reason for Not Completing Assessment: No data recorded  Collateral Involvement: mother:  "Todd Thompson is not talking and acting right"   Does Patient Have a Automotive engineer Guardian? No data recorded Name and Contact of Legal Guardian: No data recorded If Minor and Not Living with Parent(s), Who has Custody? No data recorded Is CPS involved or ever been involved? No data recorded Is APS involved or ever been involved? No data recorded  Patient Determined To Be At Risk for Harm To Self or Others Based on Review of Patient Reported Information or Presenting Complaint? No  Method: No data recorded Availability of Means: No data recorded Intent: No data recorded Notification Required: No data recorded Additional Information for Danger to Others Potential: No data recorded Additional Comments for Danger to Others Potential: No data recorded Are There Guns or Other Weapons in Your Home? No data recorded Types of  Guns/Weapons: No data recorded Are These Weapons Safely Secured?                            No data recorded Who Could Verify You Are Able To Have These Secured: No data recorded Do You Have any Outstanding Charges, Pending Court Dates, Parole/Probation? No data recorded Contacted To Inform of Risk of Harm To Self or Others: No data recorded  Location of Assessment: [facility] Assessment [facility]   Does Patient Present under Involuntary Commitment? No  IVC Papers Initial File Date: No data recorded  [place] of Residence: [place]   Patient Currently Receiving the Following Services: Not Receiving Services   Determination of Need: Routine (7 days)   Options For Referral: Medication Management; BH Urgent Care     CCA Biopsychosocial Intake/Chief Complaint:  Todd Thompson is a 35yo male reporting to [facility] as a walk-in accompanied by his mother for evaluation of concerning behaviors that his mother is seeing. Pt and mother both report that pt has been off medication for a while. Pts mother reports that pt has a previous diagnosis of schizophrenia and "used to get those shots every other month".  Pt and mother both state that they don't feel the shots were managing symptoms. Pts mother reports the pt has not been himself lately. Pts mother reports to triage  that pt has been smoking methamphetamine, but pt denies during assessment. Pt did report that he drinks alcohol daily and that "drinking is really good for me--it unlocks all that happiness inside of me". Pt reports that occasionally smokes THC. Pt denies any HI or AVH--pts mother states that "he's talking about all kinds of stuff" and "he's yelling at nobody".  Pt reports that he wants someone to prescribe Ritalin and Valium for him--"they are the only medications that have ever worked for me".  Pt reports that he was prescribed Ritalin and Valium 25 years ago and that he is having a hard time finding a provider that will write Rx for them.  Pt denies  that he is a danger to himself tonight--pts mother feels that he is a danger to himself tonight.  Current Symptoms/Problems: No data recorded  Patient Reported Schizophrenia/Schizoaffective Diagnosis in Past: Yes   Strengths: Family supports  Preferences: outpatient tx  Abilities: No data recorded  Type of Services Patient Feels are Needed: outpatient tx--mother wants inpatient tx   Initial Clinical Notes/Concerns: restlessness, agitation, pt annoyed with assessment process   Mental Health Symptoms Depression:  Hopelessness; Irritability; Sleep (too much or little); Fatigue; Difficulty Concentrating; Tearfulness   Duration of Depressive symptoms: Greater than two weeks   Mania:  No data recorded  Anxiety:   Worrying; Difficulty concentrating; Fatigue; Irritability; Restlessness   Psychosis:  Grossly disorganized or catatonic behavior (talking to self--bizarre behavior during assessment)   Duration of Psychotic symptoms: Greater than six months   Trauma:  None   Obsessions:  None   Compulsions:  None   Inattention:  None   Hyperactivity/Impulsivity:  Talks excessively   Oppositional/Defiant Behaviors:  Argumentative   Emotional Irregularity:  Mood lability   Other Mood/Personality Symptoms:  No data recorded   Mental Status Exam Appearance and self-care  Stature:  Average   Weight:  Overweight   Clothing:  Neat/clean   Grooming:  Normal   Cosmetic use:  None   Posture/gait:  Tense; Bizarre   Motor activity:  Agitated   Sensorium  Attention:  Distractible   Concentration:  Scattered; Variable   Orientation:  X5   Recall/memory:  Normal   Affect and Mood  Affect:  Anxious; Labile   Mood:  Anxious; Angry   Relating  Eye contact:  Staring   Facial expression:  Angry; Anxious   Attitude toward examiner:  Argumentative; Guarded; Irritable; Suspicious; Resistant   Thought and Language  Speech flow: Clear and Coherent   Thought content:   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")   Preoccupation:  No data recorded  Hallucinations:  None   Organization:  No data recorded  Affiliated Computer ServicesExecutive Functions  Fund of Knowledge:  Fair   Intelligence:  Average   Abstraction:  Functional   Judgement:  Impaired   Reality Testing:  Variable   Insight:  Gaps   Decision Making:  Impulsive   Social Functioning  Social Maturity:  Irresponsible   Social Judgement:  Heedless   Stress  Stressors:  Family conflict   Coping Ability:  Human resources officerverwhelmed   Skill Deficits:  Self-control   Supports:  Family     Religion:    Leisure/Recreation: Leisure / Recreation Do You Have Hobbies?: Yes Leisure and Hobbies: relax, TV, phone, walk  Exercise/Diet: Exercise/Diet Do You Exercise?: No Have You Gained or Lost A Significant Amount of Weight in the Past Six Months?: No Do You Follow a Special Diet?: No Do You Have Any Trouble  Sleeping?: Yes Explanation of Sleeping Difficulties: insomnia   CCA Employment/Education Employment/Work Situation: Employment / Work Situation Employment situation: Unemployed Has patient ever been in the [employer]?: No  Education: Education Is Patient Currently Attending School?: No   CCA Family/Childhood History Family and Relationship History:    Childhood History:  Childhood History By whom was/is the patient raised?: Mother Additional childhood history information: stable Description of patient's relationship with caregiver when they were a child: stable Patient's description of current relationship with people who raised him/her: stable Does patient have siblings?: No ("not really....people call me "bro" all the time but they are not really my brothers") Did patient suffer any verbal/emotional/physical/sexual abuse as a child?:  (UTA) Did patient suffer from severe childhood neglect?:  (UTA) Has patient ever been sexually abused/assaulted/raped as an adolescent or adult?:  (UTA) Was the patient  ever a victim of a crime or a disaster?:  (UTA) Witnessed domestic violence?:  (UTA) Has patient been affected by domestic violence as an adult?:  Industrial/product designer)  Child/Adolescent Assessment:     CCA Substance Use Alcohol/Drug Use: Alcohol / Drug Use Pain Medications: see MAR Prescriptions: see MAR Over the Counter: see MAR History of alcohol / drug use?: Yes Substance #1 Name of Substance 1: etoh 1 - Amount (size/oz): variable 1 - Frequency: daily 1 - Duration: many years 1 - Last Use / Amount: yesterday 1 - Method of Aquiring: store 1- Route of Use: oral/drink Substance #2 Name of Substance 2: THC 2 - Age of First Use: unknown 2 - Amount (size/oz): unknown 2 - Frequency: rarely 2 - Last Use / Amount: unknown 2 - Method of Aquiring: unknown 2 - Route of Substance Use: oral/smoke Substance #3 Name of Substance 3: methamphetamine 3 - Age of First Use: unknown 3 - Amount (size/oz): unknown 3 - Frequency: unknown 3 - Duration: unknown 3 - Last Use / Amount: unknown 3 - Method of Aquiring: unknown 3 - Route of Substance Use: unknown                   ASAM's:  Six Dimensions of Multidimensional Assessment  Dimension 1:  Acute Intoxication and/or Withdrawal Potential:   Dimension 1:  Description of individual's past and current experiences of substance use and withdrawal: etoh, THC, methamphetamine  Dimension 2:  Biomedical Conditions and Complications:      Dimension 3:  Emotional, Behavioral, or Cognitive Conditions and Complications:     Dimension 4:  Readiness to Change:     Dimension 5:  Relapse, Continued use, or Continued Problem Potential:     Dimension 6:  Recovery/Living Environment:     ASAM Severity Score: ASAM's Severity Rating Score: 10  ASAM Recommended Level of Treatment:     Substance use Disorder (SUD)    Recommendations for Services/Supports/Treatments: Recommendations for Services/Supports/Treatments Recommendations For  Services/Supports/Treatments: Individual Therapy,Medication Management (pt walked out without being seen)  DSM5 Diagnoses: There are no problems to display for this patient.  Referrals to Alternative Service(s): Referred to Alternative Service(s):   Place:   Date:   Time:    Referred to Alternative Service(s):   Place:   Date:   Time:    Referred to Alternative Service(s):   Place:   Date:   Time:    Referred to Alternative Service(s):   Place:   Date:   Time:     Ernest Haber Nera Haworth, LCSW

## 2020-11-11 NOTE — Progress Notes (Signed)
   11/11/20 1827  [facility] Triage Screening (Walk-ins at [facility] only)  What Is the Reason for Your Visit/Call Today? Patient presents reporting he has been off of meds for a while.  Patient is restless, pacing in the lobby, possibly impaired.  Patient's mother states he needs to be seen urgently, as he "is not acting like himself." Patient has been off of medications for over 8 months.  He ha recently used meth, 5 days ago.  He states he has multiple psych diagnoses,however unable to give diagnoses.  How Long Has This Been Causing You Problems? 1-6 months  Have You Recently Had Any Thoughts About Hurting Yourself? No  Are You Planning to Commit Suicide/Harm Yourself At This time? No  Have you Recently Had Thoughts About Hurting Someone Karolee Ohs? No (Phreesia 09/05/2020)  Are You Planning To Harm Someone At This Time? No  Are you currently experiencing any auditory, visual or other hallucinations? Yes  Please explain the hallucinations you are currently experiencing: Patient denies, however mother states he has been "talking/yelling at people who aren't there."  Have You Used Any Alcohol or Drugs in the Past 24 Hours? No (recent meth use, states last use was 5 days ago)  Do you have any current medical co-morbidities that require immediate attention? No  Clinician description of patient physical appearance/behavior: Patient appears anxious, peering around the room, laughing inappropritely.  He is cooperative, stating he needs to get back on medications for "all the diagnoses I have.  Check the computer, I have all of them."  He is vague regarding psych history.  What Do You Feel Would Help You the Most Today? Medication(s)  If access to [facility] was not available, would you have sought care in the Emergency Department? No  Determination of Need Urgent (48 hours)  Options For Referral Medication Management;BH Urgent Care

## 2020-11-13 ENCOUNTER — Telehealth (HOSPITAL_COMMUNITY): Payer: Self-pay | Admitting: General Practice

## 2020-11-13 NOTE — BH Assessment (Signed)
Care Management - Follow Up [facility]UC Discharges   Writer attempted to make contact with patient today and was unsuccessful.  Writer was able to leave a HIPPA compliant voice message and will await callback.   

## 2022-08-21 ENCOUNTER — Emergency Department (HOSPITAL_COMMUNITY)
Admission: EM | Admit: 2022-08-21 | Discharge: 2022-08-22 | Disposition: A | Discharge status: Home / Self Care | Payer: Medicaid Other | Attending: Emergency Medicine | Admitting: Emergency Medicine

## 2022-08-21 ENCOUNTER — Encounter (HOSPITAL_COMMUNITY): Payer: Self-pay | Admitting: *Deleted

## 2022-08-21 ENCOUNTER — Other Ambulatory Visit: Payer: Self-pay

## 2022-08-21 DIAGNOSIS — F419 Anxiety disorder, unspecified: Secondary | ICD-10-CM | POA: Insufficient documentation

## 2022-08-21 DIAGNOSIS — F1510 Other stimulant abuse, uncomplicated: Secondary | ICD-10-CM | POA: Insufficient documentation

## 2022-08-21 DIAGNOSIS — G8929 Other chronic pain: Secondary | ICD-10-CM

## 2022-08-21 DIAGNOSIS — F1110 Opioid abuse, uncomplicated: Secondary | ICD-10-CM | POA: Diagnosis not present

## 2022-08-21 DIAGNOSIS — R0789 Other chest pain: Secondary | ICD-10-CM | POA: Diagnosis not present

## 2022-08-21 DIAGNOSIS — F1910 Other psychoactive substance abuse, uncomplicated: Secondary | ICD-10-CM

## 2022-08-21 LAB — CBC WITH DIFFERENTIAL/PLATELET
Abs Immature Granulocytes: 0.04 10*3/uL (ref 0.00–0.07)
Basophils Absolute: 0.1 10*3/uL (ref 0.0–0.1)
Basophils Relative: 1 %
Eosinophils Absolute: 0.4 10*3/uL (ref 0.0–0.5)
Eosinophils Relative: 3 %
HCT: 46.9 % (ref 39.0–52.0)
Hemoglobin: 16.2 g/dL (ref 13.0–17.0)
Immature Granulocytes: 0 %
Lymphocytes Relative: 23 %
Lymphs Abs: 2.7 10*3/uL (ref 0.7–4.0)
MCH: 29.3 pg (ref 26.0–34.0)
MCHC: 34.5 g/dL (ref 30.0–36.0)
MCV: 85 fL (ref 80.0–100.0)
Monocytes Absolute: 1.2 10*3/uL — ABNORMAL HIGH (ref 0.1–1.0)
Monocytes Relative: 10 %
Neutro Abs: 7.7 10*3/uL (ref 1.7–7.7)
Neutrophils Relative %: 63 %
Platelets: 237 10*3/uL (ref 150–400)
RBC: 5.52 MIL/uL (ref 4.22–5.81)
RDW: 13.3 % (ref 11.5–15.5)
WBC: 12.1 10*3/uL — ABNORMAL HIGH (ref 4.0–10.5)
nRBC: 0 % (ref 0.0–0.2)

## 2022-08-21 LAB — URINALYSIS, ROUTINE W REFLEX MICROSCOPIC
Bilirubin Urine: NEGATIVE
Glucose, UA: NEGATIVE mg/dL
Hgb urine dipstick: NEGATIVE
Ketones, ur: NEGATIVE mg/dL
Leukocytes,Ua: NEGATIVE
Nitrite: NEGATIVE
Protein, ur: NEGATIVE mg/dL
Specific Gravity, Urine: 1.008 (ref 1.005–1.030)
pH: 5 (ref 5.0–8.0)

## 2022-08-21 LAB — RAPID URINE DRUG SCREEN, HOSP PERFORMED
Amphetamines: POSITIVE — AB
Barbiturates: NOT DETECTED
Benzodiazepines: NOT DETECTED
Cocaine: NOT DETECTED
Opiates: NOT DETECTED
Tetrahydrocannabinol: POSITIVE — AB

## 2022-08-21 LAB — COMPREHENSIVE METABOLIC PANEL
ALT: 67 U/L — ABNORMAL HIGH (ref 0–44)
AST: 61 U/L — ABNORMAL HIGH (ref 15–41)
Albumin: 4.2 g/dL (ref 3.5–5.0)
Alkaline Phosphatase: 94 U/L (ref 38–126)
Anion gap: 12 (ref 5–15)
BUN: 5 mg/dL — ABNORMAL LOW (ref 6–20)
CO2: 26 mmol/L (ref 22–32)
Calcium: 9.6 mg/dL (ref 8.9–10.3)
Chloride: 98 mmol/L (ref 98–111)
Creatinine, Ser: 1.2 mg/dL (ref 0.61–1.24)
GFR, Estimated: >60 mL/min (ref >60)
Glucose, Bld: 105 mg/dL — ABNORMAL HIGH (ref 70–99)
Potassium: 3.7 mmol/L (ref 3.5–5.1)
Sodium: 136 mmol/L (ref 135–145)
Total Bilirubin: 1.1 mg/dL (ref 0.3–1.2)
Total Protein: 8.3 g/dL — ABNORMAL HIGH (ref 6.5–8.1)

## 2022-08-21 LAB — ETHANOL: Alcohol, Ethyl (B): 25 mg/dL — ABNORMAL HIGH (ref <10)

## 2022-08-21 NOTE — ED Provider Triage Note (Signed)
Emergency Medicine Provider Triage Evaluation Note  Todd Thompson , a 36 y.o. male  was evaluated in triage.  Pt complains of anxiety and depression. States that he has been struggling with this time.  States that he is on several antidepressant and antianxiety medication but does not feel like it is helping him.  States he just got approved for Medicaid and was hoping that he can get new meds.  He denies any SI/HI or AVH. Also endorses some dysuria.  Denies any other somatic complaints.  Review of Systems  Positive:  Negative:   Physical Exam  BP 95/60 (BP Location: Right Arm)   Pulse (!) 125   Temp 98.6 F (37 C)   Resp 20   Ht 5\' 7"  (1.702 m)   Wt 118.4 kg   SpO2 95%   BMI 40.88 kg/m  Gen:   Awake, no distress   Resp:  Normal effort  MSK:   Moves extremities without difficulty  Other:    Medical Decision Making  Medically screening exam initiated at 7:30 PM.  Appropriate orders placed.  Todd Thompson was informed that the remainder of the evaluation will be completed by another provider, this initial triage assessment does not replace that evaluation, and the importance of remaining in the ED until their evaluation is complete.     Judithann Graves, PA-C 08/21/22 1932

## 2022-08-21 NOTE — ED Triage Notes (Signed)
The pt is depressed and anxious  he sees someone at [facility] but the med is not helping  and he reports that he has been this way all his life

## 2022-08-22 LAB — CK: Total CK: 471 U/L — ABNORMAL HIGH (ref 49–397)

## 2022-08-22 MED ORDER — NAPROXEN 500 MG PO TABS: 500.0000 mg | ORAL_TABLET | Freq: Two times a day (BID) | ORAL | 0 refills | Status: AC

## 2022-08-22 MED ORDER — HYDROXYZINE HCL 25 MG PO TABS
25.0000 mg | ORAL_TABLET | Freq: Once | ORAL | Status: AC
  Administered 2022-08-22: 25 mg via ORAL
  Filled 2022-08-22: qty 1

## 2022-08-22 MED ORDER — KETOROLAC TROMETHAMINE 15 MG/ML IJ SOLN
15.0000 mg | Freq: Once | INTRAMUSCULAR | Status: DC
  Filled 2022-08-22: qty 1

## 2022-08-22 MED ORDER — SODIUM CHLORIDE 0.9 % IV BOLUS: 1000.0000 mL | Freq: Once | INTRAVENOUS | Status: DC

## 2022-08-22 NOTE — ED Provider Notes (Signed)
[facility] EMERGENCY DEPARTMENT Provider Note   CSN: [id number] Arrival date & time: 08/21/22  1850     History  Chief Complaint  Patient presents with   anxious   Depression    Todd Thompson is a 36 y.o. male.  36 year old male presents to the emergency department complaining of diffuse body pain.  He states that he has been experiencing chronic pain for quite some time.  More recently, pain has been more focused to his head and neck, bilateral feet, and genital area.  He takes Tylenol for symptoms which provides little relief.  As a result, he will occasionally buy pain medication such as Vicodin illegally off of the street.  Does continue to use other illicit substances including heroin and methamphetamines.  Drinks 1-2 "[employer]" per day.  States that his pain sometimes exacerbates his anxiety.  He is being followed by [facility] for management of depression and anxiety.  Has been taking these medications as prescribed.  No SI/HI or AVH.  The history is provided by the patient. No language interpreter was used.  Depression      Home Medications Prior to Admission medications   Medication Sig Start Date End Date Taking? Authorizing Provider  amphetamine-dextroamphetamine (ADDERALL) 30 MG tablet Take 30 mg by mouth daily.    [provider]  clotrimazole (LOTRIMIN) 1 % cream Apply to affected area 2 times daily 03/15/19   Hedges, Tinnie Gens, PA-C  naproxen (NAPROSYN) 500 MG tablet Take 1 tablet (500 mg total) by mouth 2 (two) times daily with a meal. 08/22/22   Antony Madura, PA-C  traMADol (ULTRAM) 50 MG tablet Take 1 tablet (50 mg total) by mouth every 6 (six) hours as needed. 12/12/16   Audry Pili, PA-C      Allergies    Patient has no known allergies.    Review of Systems   Review of Systems  Psychiatric/Behavioral:  Positive for depression.   Ten systems reviewed and are negative for acute change, except as noted in the HPI.    Physical  Exam Updated Vital Signs BP 113/69   Pulse 97   Temp 98 F (36.7 C) (Oral)   Resp 17   Ht 5\' 7"  (1.702 m)   Wt 118.4 kg   SpO2 97%   BMI 40.88 kg/m   Physical Exam Vitals and nursing note reviewed.  Constitutional:      General: He is not in acute distress.    Appearance: He is well-developed. He is not diaphoretic.     Comments: Nontoxic appearing and in NAD. Morbidly obese, foul smelling.  HENT:     Head: Normocephalic and atraumatic.  Eyes:     General: No scleral icterus.    Conjunctiva/sclera: Conjunctivae normal.  Pulmonary:     Effort: Pulmonary effort is normal. No respiratory distress.     Comments: Respirations even and unlabored Genitourinary:    Comments: Normal external genitalia. Normal penis. No scrotal swelling or testicular TTP. No lesions, penile discharge. Exam chaperoned by RN. Musculoskeletal:        General: Normal range of motion.     Cervical back: Normal range of motion.  Skin:    General: Skin is warm and dry.     Coloration: Skin is not pale.     Findings: No erythema or rash.  Neurological:     Mental Status: He is alert and oriented to person, place, and time.  Psychiatric:        Behavior: Behavior normal.  ED Results / Procedures / Treatments   Labs (all labs ordered are listed, but only abnormal results are displayed) Labs Reviewed  COMPREHENSIVE METABOLIC PANEL - Abnormal; Notable for the following components:      Result Value   Glucose, Bld 105 (*)    BUN 5 (*)    Total Protein 8.3 (*)    AST 61 (*)    ALT 67 (*)    All other components within normal limits  ETHANOL - Abnormal; Notable for the following components:   Alcohol, Ethyl (B) 25 (*)    All other components within normal limits  RAPID URINE DRUG SCREEN, HOSP PERFORMED - Abnormal; Notable for the following components:   Amphetamines POSITIVE (*)    Tetrahydrocannabinol POSITIVE (*)    All other components within normal limits  CBC WITH DIFFERENTIAL/PLATELET -  Abnormal; Notable for the following components:   WBC 12.1 (*)    Monocytes Absolute 1.2 (*)    All other components within normal limits  CK - Abnormal; Notable for the following components:   Total CK 471 (*)    All other components within normal limits  URINALYSIS, ROUTINE W REFLEX MICROSCOPIC    EKG None  Radiology No results found.  Procedures Procedures    Medications Ordered in ED Medications  sodium chloride 0.9 % bolus 1,000 mL (has no administration in time range)  ketorolac (TORADOL) 15 MG/ML injection 15 mg (has no administration in time range)  hydrOXYzine (ATARAX) tablet 25 mg (25 mg Oral Given 08/22/22 0126)    ED Course/ Medical Decision Making/ A&P                           Medical Decision Making Amount and/or Complexity of Data Reviewed Labs: ordered.  Risk Prescription drug management.   This patient presents to the ED for concern of chronic diffuse body pain, this involves an extensive number of treatment options, and is a complaint that carries with it a high risk of complications and morbidity.  The differential diagnosis includes rhabdomyolysis vs chronic pain syndrome vs myositis vs fibromyalgia.   Co morbidities that complicate the patient evaluation  Obesity  Polysubstance use Anxiety and depression   Additional history obtained:  Additional history obtained from mother, at bedside External records from outside source obtained and reviewed including prior LFT elevation in 2020; AST of 61 and ALT of 97   Lab Tests:  I Ordered, and personally interpreted labs.  The pertinent results include:  AST of 61 and ALT of 67 (chronic, stable), WBC of 12.1 without left shift (11.6 in 2020, likely chronically elevated), Ethanol of 25, CK of 471, UDS positive for THC and amphetamines   Cardiac Monitoring:  The patient was maintained on a cardiac monitor.  I personally viewed and interpreted the cardiac monitored which showed an underlying  rhythm of: sinus tachycardia > NSR   Medicines ordered and prescription drug management:  I ordered medication including Atarax for anxiety I have reviewed the patients home medicines and have made adjustments as needed   Problem List / ED Course:  Offered IVF which patient initially agreed to and subsequently declined. C/o chronic pain which is diffuse and throughout his body; mostly focused to head/neck, genitals and b/l feet recently. No signs of any skin or soft tissue infection. No meningismus. GU exam is unremarkable CK is normal, ruling out rhabdomyolysis. Question whether patient may have a degree of neuropathy from polysubstance abuse. This  could also distort his baseline pain scale and contribute to development of a chronic pain syndrome Given chronicity have discussed PCP follow up. He may benefit from referral to pain management in the future.  Sees [facility] for psychiatric management. Denies SI/HI today. Declines to speak with TTS.   Reevaluation:  After the interventions noted above, I reevaluated the patient and found that they have : remained stable   Social Determinants of Health:  Not established with PCP Polysubstance abuse history   Dispostion:  After consideration of the diagnostic results and the patients response to treatment, I feel that the patent would benefit from outpatient PCP follow up. Given Rx for naproxen for pain to use PRN. Return precautions discussed and provided. Patient discharged in stable condition with no unaddressed concerns.          Final Clinical Impression(s) / ED Diagnoses Final diagnoses:  Other chronic pain  Anxiety  Polysubstance abuse (HCC)    Rx / DC Orders ED Discharge Orders          Ordered    naproxen (NAPROSYN) 500 MG tablet  2 times daily with meals        08/22/22 0145              Antony Madura, PA-C 08/22/22 0257    Nira Conn, MD 08/22/22 [phone]

## 2022-08-22 NOTE — ED Notes (Signed)
RN reviewed discharge instructions with pt. Pt verbalized understanding and had no further questions. VSS upon discharge.  

## 2022-08-22 NOTE — Discharge Instructions (Signed)
You may use naproxen as prescribed for management of pain.  Discontinue use of illicit substances.  We do advise follow-up with a primary care doctor.  Return for new or concerning symptoms.

## 2024-03-24 DEATH — deceased
